# Patient Record
Sex: Female | Born: 1970 | Race: White | Hispanic: No | Marital: Single | Smoking: Former smoker
Health system: Southern US, Community
[De-identification: ages and names within clinical notes are randomized; demographics above are authoritative.]

## PROBLEM LIST (undated history)

## (undated) DIAGNOSIS — E119 Type 2 diabetes mellitus without complications: Secondary | ICD-10-CM

## (undated) DIAGNOSIS — E7800 Pure hypercholesterolemia, unspecified: Secondary | ICD-10-CM

## (undated) DIAGNOSIS — H548 Legal blindness, as defined in USA: Secondary | ICD-10-CM

## (undated) DIAGNOSIS — F411 Generalized anxiety disorder: Secondary | ICD-10-CM

## (undated) DIAGNOSIS — E559 Vitamin D deficiency, unspecified: Secondary | ICD-10-CM

## (undated) DIAGNOSIS — I10 Essential (primary) hypertension: Secondary | ICD-10-CM

## (undated) DIAGNOSIS — E1142 Type 2 diabetes mellitus with diabetic polyneuropathy: Secondary | ICD-10-CM

## (undated) DIAGNOSIS — N183 Chronic kidney disease, stage 3 unspecified: Secondary | ICD-10-CM

## (undated) HISTORY — PX: MASTECTOMY: SHX3

## (undated) HISTORY — DX: Type 2 diabetes mellitus without complications: E11.9

## (undated) HISTORY — PX: TRIGGER FINGER RELEASE: SHX641

## (undated) HISTORY — DX: Chronic kidney disease, stage 3 unspecified: N18.30

## (undated) HISTORY — DX: Essential (primary) hypertension: I10

## (undated) HISTORY — DX: Vitamin D deficiency, unspecified: E55.9

## (undated) HISTORY — PX: DILATION AND CURETTAGE OF UTERUS: SHX78

## (undated) HISTORY — DX: Type 2 diabetes mellitus with diabetic polyneuropathy: E11.42

## (undated) HISTORY — PX: SHOULDER SURGERY: SHX246

## (undated) HISTORY — DX: Legal blindness, as defined in USA: H54.8

## (undated) HISTORY — DX: Pure hypercholesterolemia, unspecified: E78.00

## (undated) HISTORY — DX: Generalized anxiety disorder: F41.1

---

## 2009-11-23 ENCOUNTER — Emergency Department: Payer: Self-pay | Admitting: Emergency Medicine

## 2020-12-25 ENCOUNTER — Other Ambulatory Visit: Payer: Self-pay | Admitting: *Deleted

## 2020-12-25 ENCOUNTER — Encounter: Payer: Self-pay | Admitting: *Deleted

## 2020-12-27 ENCOUNTER — Ambulatory Visit: Payer: Medicare HMO | Admitting: Diagnostic Neuroimaging

## 2020-12-27 ENCOUNTER — Encounter: Payer: Self-pay | Admitting: Diagnostic Neuroimaging

## 2020-12-27 ENCOUNTER — Ambulatory Visit (INDEPENDENT_AMBULATORY_CARE_PROVIDER_SITE_OTHER): Payer: Medicare HMO | Admitting: Diagnostic Neuroimaging

## 2020-12-27 ENCOUNTER — Other Ambulatory Visit: Payer: Self-pay

## 2020-12-27 VITALS — BP 95/56 | HR 79 | Ht 64.0 in | Wt 122.0 lb

## 2020-12-27 DIAGNOSIS — R270 Ataxia, unspecified: Secondary | ICD-10-CM | POA: Diagnosis not present

## 2020-12-27 DIAGNOSIS — R42 Dizziness and giddiness: Secondary | ICD-10-CM

## 2020-12-27 DIAGNOSIS — M542 Cervicalgia: Secondary | ICD-10-CM | POA: Diagnosis not present

## 2020-12-27 NOTE — Progress Notes (Signed)
[facility]  PATIENT: Sara Cooper DOB: 04-17-1971  REFERRING CLINICIAN: Shellia Cleverly, PA HISTORY FROM: patient  REASON FOR VISIT: new consult    HISTORICAL  CHIEF COMPLAINT:  Chief Complaint  Patient presents with  . DDD, dizziness    Rm 6 New Pt "DDD in neck, numbness/tingling in my back, knees give way; dizziness, started 3 years ago, getting worse"    HISTORY OF PRESENT ILLNESS:   50 year old female here for evaluation of abnormal spells.  History of type 1 diabetes, anxiety, diabetic neuropathy.    For past 2 years patient is having intermittent pain in her forehead, pain and numbness in her neck radiating down her spine and back into her legs.  Episodes can last 30 seconds at a time then resolved.  No specific triggering factors.  Symptoms are happening once a year initially and now happening about once a month.  No loss of consciousness.  No convulsions.   REVIEW OF SYSTEMS: Full 14 system review of systems performed and negative with exception of: as per HPI.  ALLERGIES: Allergies  Allergen Reactions  . Celecoxib     Other reaction(s): Other (See Comments) Swelling Swelling     HOME MEDICATIONS: Outpatient Medications Prior to Visit  Medication Sig Dispense Refill  . Cholecalciferol 50 MCG (2000 UT) CAPS Take 1 capsule by mouth daily.    . Dasiglucagon HCl (ZEGALOGUE) 0.6 MG/0.6ML SOAJ Inject into the skin.    Marland Kitchen gabapentin (NEURONTIN) 300 MG capsule Take by mouth. Two daily    . insulin aspart (NOVOLOG) 100 UNIT/ML injection Use up to 60 units [place] daily in insulin pump    . insulin detemir (LEVEMIR) 100 UNIT/ML injection INJECT 20 UNITS SUBCUTANEOUSLY EVERY EVENING AS DIRECTED    . Insulin Disposable Pump (OMNIPOD DASH 5 PACK PODS) MISC CHANGE DASH PODS EVERY 48 HOURS AS DIRECTED    . insulin glargine (LANTUS) 100 UNIT/ML injection Inject into the skin as needed.    Marland Kitchen losartan (COZAAR) 100 MG tablet Take 1 tablet by mouth daily.    .  meclizine (ANTIVERT) 25 MG tablet Take 1 tablet by mouth 3 (three) times daily as needed.    . pravastatin (PRAVACHOL) 10 MG tablet Take 1 tablet by mouth daily.    . promethazine (PHENERGAN) 25 MG tablet Take by mouth.    . zolpidem (AMBIEN) 10 MG tablet TAKE 1 TABLET BY MOUTH NIGHTLY AS NEEDEDFOR SLEEP    . ALPRAZolam (XANAX) 0.5 MG tablet TAKE 1 TABLET BY MOUTH EVERY DAY AS NEEDED FOR ANXIETY (Patient not taking: Reported on 12/27/2020)    . DULoxetine (CYMBALTA) 30 MG capsule Take 1 capsule by mouth daily.     No facility-administered medications prior to visit.    PAST MEDICAL HISTORY: Past Medical History:  Diagnosis Date  . CKD (chronic kidney disease), stage III (HCC)   . Diabetes (HCC)    type 1  . Diabetic polyneuropathy (HCC)   . GAD (generalized anxiety disorder)   . Hypercholesteremia   . Hypertension   . Legally blind   . Vitamin D deficiency     PAST SURGICAL HISTORY: Past Surgical History:  Procedure Laterality Date  . BREAST RECONSTRUCTION Bilateral 2007  . DILATION AND CURETTAGE OF UTERUS    . MASTECTOMY Bilateral    fibrocystic   . SHOULDER SURGERY Left    bicep tendon repair  . TRIGGER FINGER RELEASE      FAMILY HISTORY: Family History  Problem Relation Age of Onset  .  Colon cancer Father   . Heart disease Father     SOCIAL HISTORY: Social History   Socioeconomic History  . Marital status: Single    Spouse name: Not on file  . Number of children: 0  . Years of education: Not on file  . Highest education level: Bachelor's degree (e.g., BA, AB, BS)  Occupational History    Comment: NA  Tobacco Use  . Smoking status: Former Games developer  . Smokeless tobacco: Never Used  . Tobacco comment: quit 2007  Substance and Sexual Activity  . Alcohol use: Never  . Drug use: Never  . Sexual activity: Not on file  Other Topics Concern  . Not on file  Social History Narrative   Lives with by friend   Social Determinants of Health   Financial Resource  Strain: Not on file  Food Insecurity: Not on file  Transportation Needs: Not on file  Physical Activity: Not on file  Stress: Not on file  Social Connections: Not on file  Intimate Partner Violence: Not on file     PHYSICAL EXAM  GENERAL EXAM/CONSTITUTIONAL: Vitals:  Vitals:   12/27/20 1133  BP: (!) 95/56  Pulse: 79  Weight: 122 lb (55.3 kg)  Height: 5\' 4"  (1.626 m)   Body mass index is 20.94 kg/m. Wt Readings from Last 3 Encounters:  12/27/20 122 lb (55.3 kg)    Patient is in no distress; well developed, nourished and groomed; neck is supple  CARDIOVASCULAR:  Examination of carotid arteries is normal; no carotid bruits  Regular rate and rhythm, no murmurs  Examination of peripheral vascular system by observation and palpation is normal  EYES:  Ophthalmoscopic exam of optic discs and posterior segments is normal; no papilledema or hemorrhages No exam data present  MUSCULOSKELETAL:  Gait, strength, tone, movements noted in Neurologic exam below  NEUROLOGIC: MENTAL STATUS:  No flowsheet data found.  awake, alert, oriented to person, place and time  recent and remote memory intact  normal attention and concentration  language fluent, comprehension intact, naming intact  fund of knowledge appropriate  CRANIAL NERVE:   2nd - no papilledema on fundoscopic exam  2nd, 3rd, 4th, 6th - pupils equal and reactive to light, visual fields full to confrontation, extraocular muscles intact, no nystagmus  5th - facial sensation symmetric  7th - facial strength symmetric  8th - hearing intact  9th - palate elevates symmetrically, uvula midline  11th - shoulder shrug symmetric  12th - tongue protrusion midline  MOTOR:   normal bulk and tone, full strength in the BUE, BLE  SENSORY:   normal and symmetric to light touch, temperature, vibration  COORDINATION:   finger-nose-finger, fine finger movements normal  REFLEXES:   deep tendon reflexes  present and symmetric  GAIT/STATION:   narrow based gait     DIAGNOSTIC DATA (LABS, IMAGING, TESTING) - I reviewed patient records, labs, notes, testing and imaging myself where available.  No results found for: WBC, HGB, HCT, MCV, PLT No results found for: NA, K, CL, CO2, GLUCOSE, BUN, CREATININE, CALCIUM, PROT, ALBUMIN, AST, ALT, ALKPHOS, BILITOT, GFRNONAA, GFRAA No results found for: CHOL, HDL, LDLCALC, LDLDIRECT, TRIG, CHOLHDL No results found for: 12/29/20 No results found for: VITAMINB12 No results found for: TSH   10/16/20 xray cervical  - No acute findings. Minimal degenerative endplate spurring of the mid to lowercervical spine.      ASSESSMENT AND PLAN  50 y.o. year old female here with:  Localization:  Dx:  1. Neck  pain   2. Dizziness and giddiness   3. Ataxia       PLAN:  - check MRI brain / cervical spine (rule out stroke, demyelinating disease, compressive myelopathy) - monitor blood sugar and blood pressure with future attacks if they occur  Orders Placed This Encounter  Procedures  . MR Brain W Wo Contrast  . MR Cervical Spine Wo Contrast   Return for pending if symptoms worsen or fail to improve.    Suanne Marker, MD 12/27/2020, 12:23 PM Certified in Neurology, Neurophysiology and Neuroimaging  [facility] [address] [place], [place] [address] [phone]

## 2020-12-28 ENCOUNTER — Telehealth: Payer: Self-pay | Admitting: Diagnostic Neuroimaging

## 2020-12-28 NOTE — Telephone Encounter (Signed)
[employer] pending uplaoded notes on the portal

## 2020-12-28 NOTE — Telephone Encounter (Signed)
Sara Cooper: [id number] (exp. 12/28/20 to 01/27/21) order sent to GI. They will reach out to the patient to schedule.

## 2021-01-15 ENCOUNTER — Ambulatory Visit
Admission: RE | Admit: 2021-01-15 | Discharge: 2021-01-15 | Disposition: A | Discharge status: Home / Self Care | Payer: Medicare HMO | Source: Ambulatory Visit | Attending: Diagnostic Neuroimaging | Admitting: Diagnostic Neuroimaging

## 2021-01-15 ENCOUNTER — Other Ambulatory Visit: Payer: Self-pay

## 2021-01-15 DIAGNOSIS — R42 Dizziness and giddiness: Secondary | ICD-10-CM

## 2021-01-15 DIAGNOSIS — R202 Paresthesia of skin: Secondary | ICD-10-CM | POA: Diagnosis not present

## 2021-01-15 DIAGNOSIS — R270 Ataxia, unspecified: Secondary | ICD-10-CM

## 2021-01-15 DIAGNOSIS — M542 Cervicalgia: Secondary | ICD-10-CM

## 2021-01-15 MED ORDER — GADOBENATE DIMEGLUMINE 529 MG/ML IV SOLN
11.0000 mL | Freq: Once | INTRAVENOUS | Status: AC | PRN
  Administered 2021-01-15: 11 mL via INTRAVENOUS

## 2021-01-18 ENCOUNTER — Telehealth: Payer: Self-pay | Admitting: *Deleted

## 2021-01-18 NOTE — Telephone Encounter (Signed)
Spoke with patient and informed her MRI brain, cervical spine are unremarkable. Continue with Dr Richrd Humbles plan.  She stated on day of MRI when she looked down and was leaning forward filling out paperwork she felt a tingling beginning at her lower back and down her legs. She has had this in past many times, in diffirent situations, makes her legs feel weak like they'll  give out, may make her nauseated. She is asking if the rest of her spine can be imaged to check for pinched nerves.  I advised will ask MD, let her know. Patient verbalized understanding, appreciation.

## 2021-01-21 ENCOUNTER — Other Ambulatory Visit: Payer: Self-pay

## 2021-03-09 ENCOUNTER — Ambulatory Visit: Payer: Medicare HMO | Admitting: Diagnostic Neuroimaging

## 2022-06-11 IMAGING — MR MR CERVICAL SPINE W/O CM
4 of 5 series · 29 of 48 positions shown · non-contrast
Comparison: Radiographs October 16, 2020

CLINICAL DATA: Neck pain, chronic, degenerative changes on x-ray.
Ataxia.

EXAM:
MRI CERVICAL SPINE WITHOUT CONTRAST
TECHNIQUE: Multiplanar, multisequence MR imaging of the cervical spine was
performed. No intravenous contrast was administered.

[Series 3: T2 · sagittal · 3.0mm · 0.66mm/px · 7 of 18 slices shown (1 of 2)]
[im 1/18]
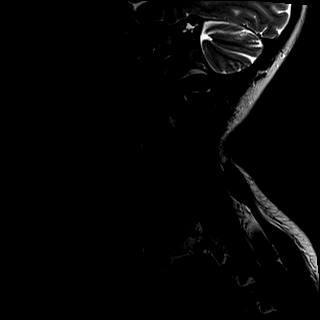
[im 3/18]
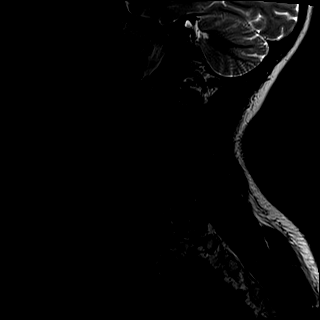
[im 6/18]
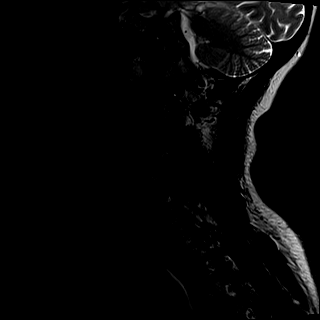
[im 9/18]
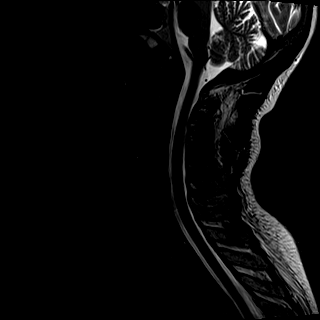
[im 12/18]
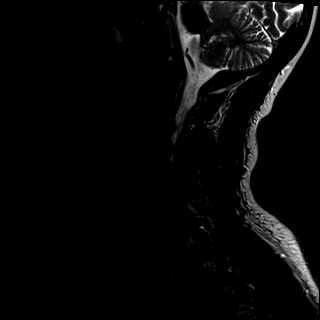
[im 15/18]
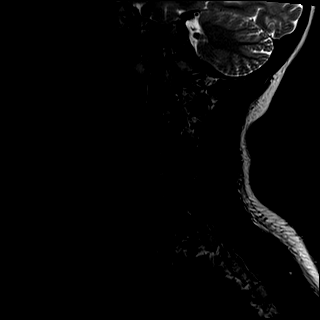
[im 18/18]
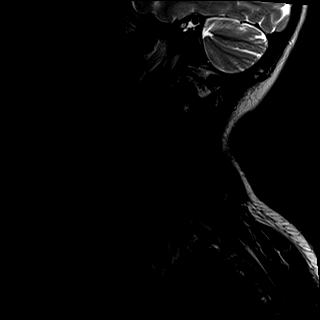

[Series 4: T1 · sagittal · 3.0mm · 0.41mm/px · 7 of 18 slices shown]
[im 1/18]
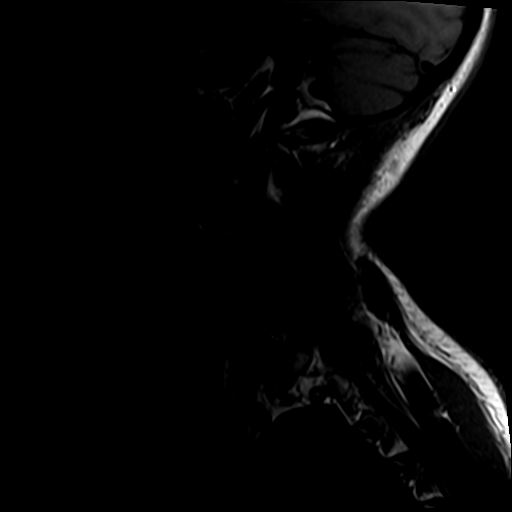
[im 3/18]
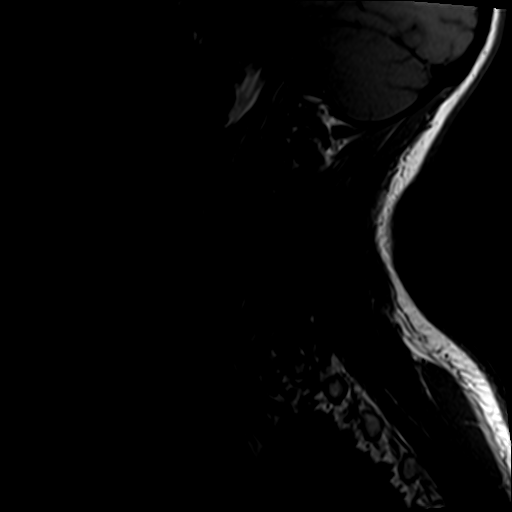
[im 6/18]
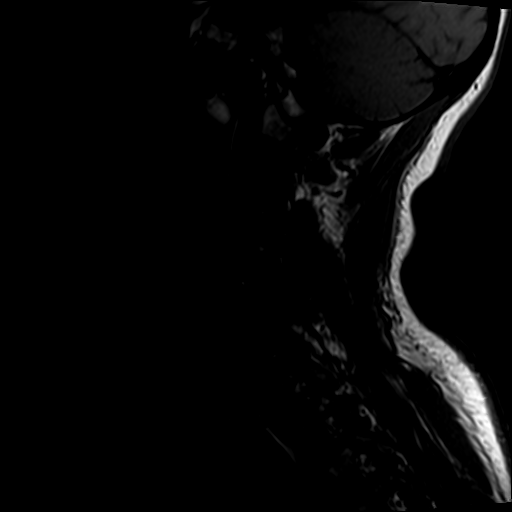
[im 9/18]
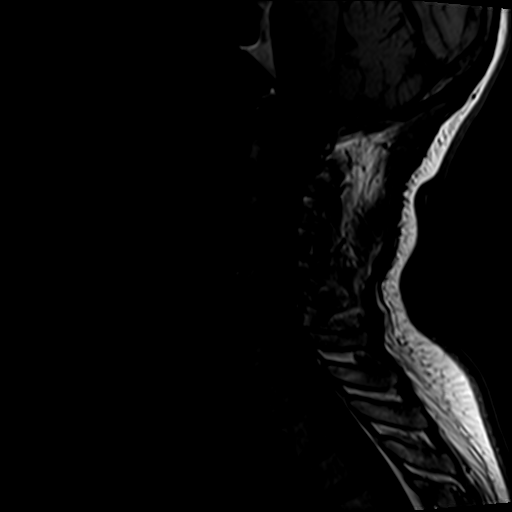
[im 12/18]
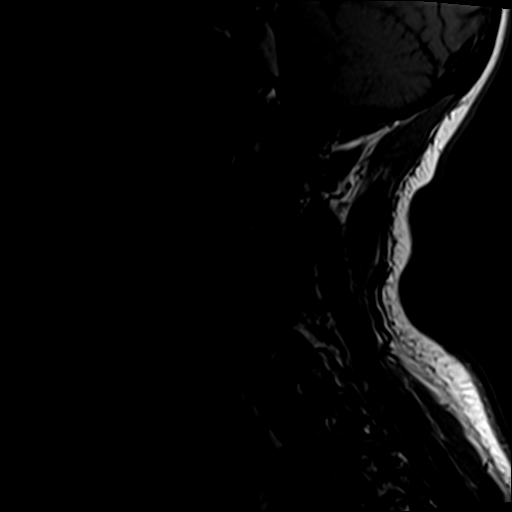
[im 15/18]
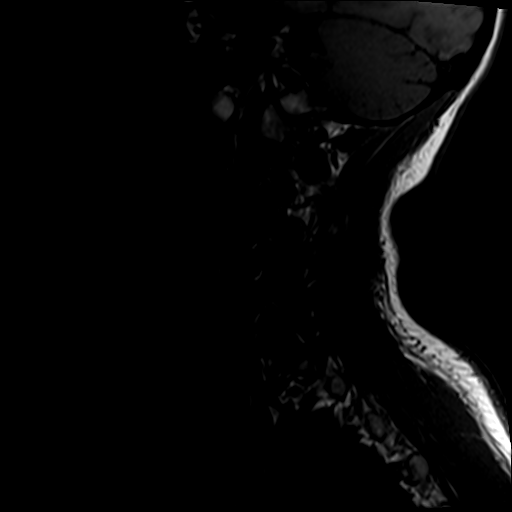
[im 18/18]
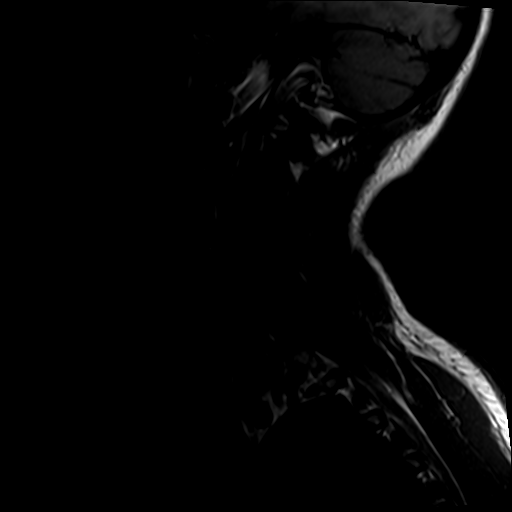

[Series 5: tir sag · sagittal · 3.0mm · 0.41mm/px · 6 of 18 slices shown]
[im 1/18]
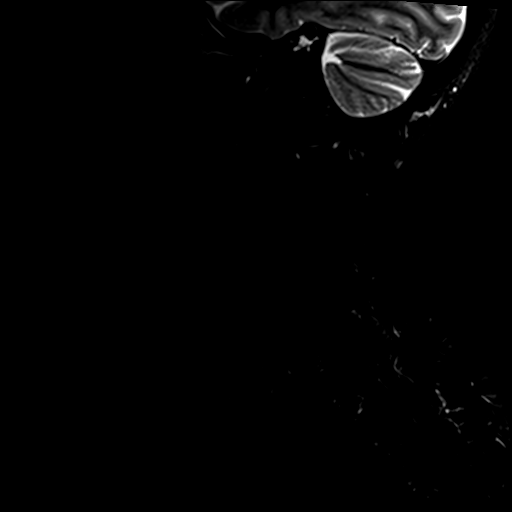
[im 3/18]
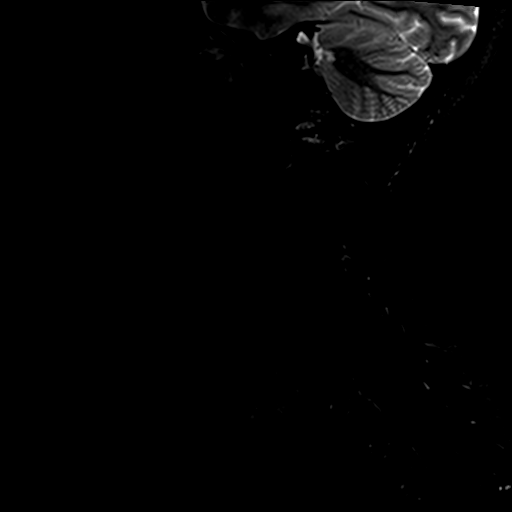
[im 5/18]
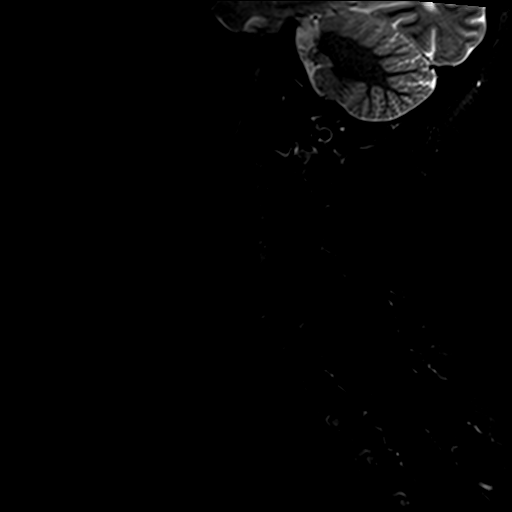
[im 8/18]
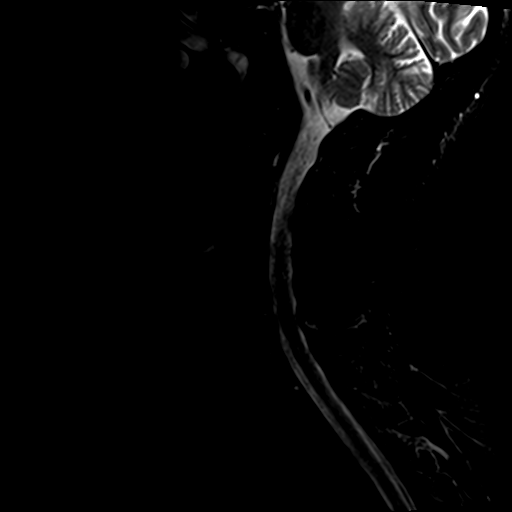
[im 10/18]
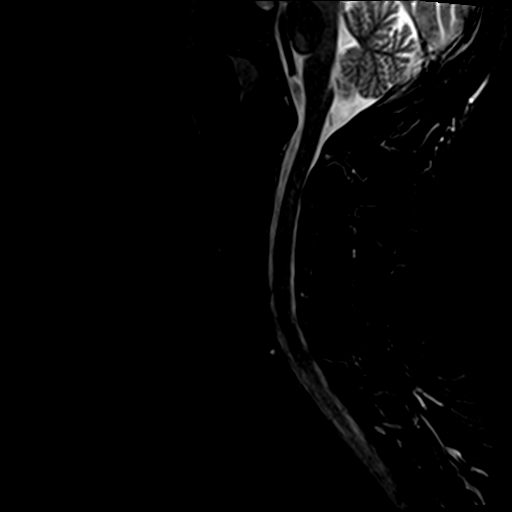
[im 15/18]
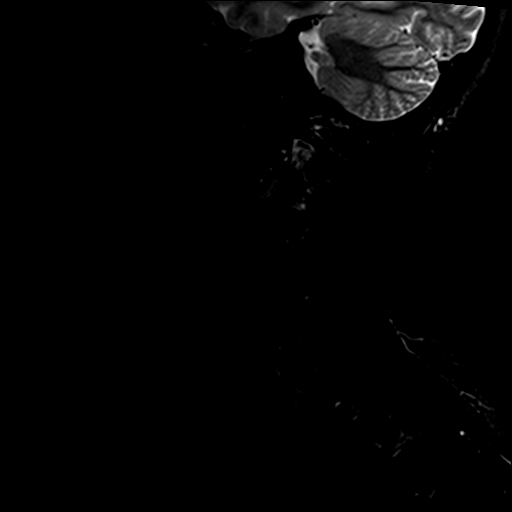

[Series 7: T2 · axial · 3.0mm · 0.70mm/px · z∈[-44,+61]mm · 9 of 30 slices shown (2 of 2)]
[im 1/30]
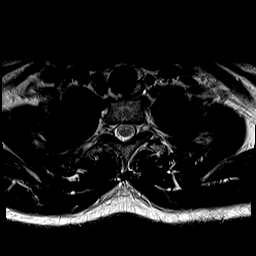
[im 5/30]
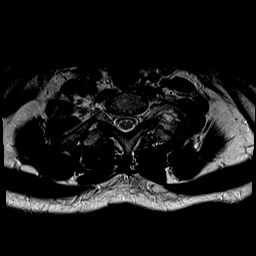
[im 10/30]
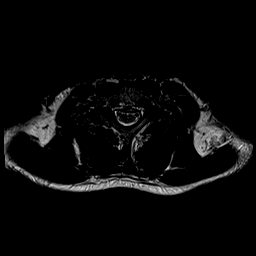
[im 13/30]
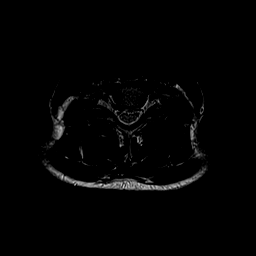
[im 15/30]
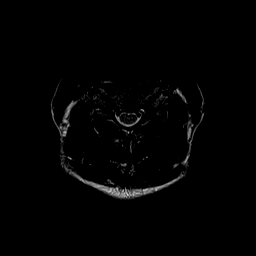
[im 17/30]
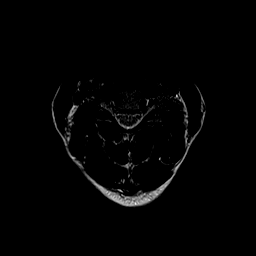
[im 20/30]
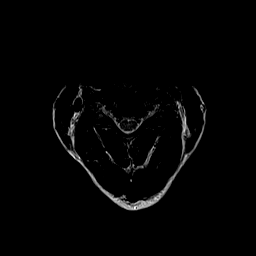
[im 25/30]
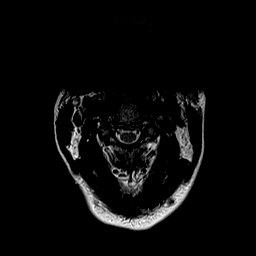
[im 30/30]
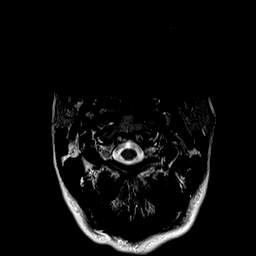

[29 of 48 positions shown; findings below may reference images not displayed]

FINDINGS: Alignment: Physiologic.

Vertebrae: No fracture, evidence of discitis, or bone lesion.

Cord: Normal signal and morphology.

Posterior Fossa, vertebral arteries, paraspinal tissues: Negative.

Disc levels:

C2-3: No spinal canal or neural foraminal stenosis.

C3-4:No spinal canal or neural foraminal stenosis.

C4-5: No spinal canal or neural foraminal stenosis.

C5-6:Tiny posterior disc protrusion. No spinal canal or neural
foraminal stenosis.

C6-7: Tiny left posterolateral disc protrusion. No spinal canal or
neural foraminal stenosis.

C7-T1: No spinal canal or neural foraminal stenosis.
IMPRESSION: Mild degenerative changes of the cervical spine without high-grade
spinal canal or neural foraminal stenosis.

## 2023-03-27 ENCOUNTER — Ambulatory Visit: Payer: Medicare HMO | Admitting: Internal Medicine
